# Patient Record
Sex: Male | Born: 1955 | Race: Black or African American | Hispanic: No | Marital: Single | Smoking: Never smoker
Health system: Southern US, Community
[De-identification: ages and names within clinical notes are randomized; demographics above are authoritative.]

## PROBLEM LIST (undated history)

## (undated) HISTORY — PX: OTHER SURGICAL HISTORY: SHX169

---

## 2006-08-28 ENCOUNTER — Emergency Department (HOSPITAL_COMMUNITY): Admission: EM | Admit: 2006-08-28 | Discharge: 2006-08-28 | Payer: Self-pay | Admitting: Emergency Medicine

## 2006-08-30 ENCOUNTER — Emergency Department (HOSPITAL_COMMUNITY): Admission: EM | Admit: 2006-08-30 | Discharge: 2006-08-30 | Payer: Self-pay | Admitting: Emergency Medicine

## 2006-09-01 ENCOUNTER — Emergency Department (HOSPITAL_COMMUNITY): Admission: EM | Admit: 2006-09-01 | Discharge: 2006-09-01 | Payer: Self-pay | Admitting: Emergency Medicine

## 2006-12-05 ENCOUNTER — Emergency Department (HOSPITAL_COMMUNITY): Admission: EM | Admit: 2006-12-05 | Discharge: 2006-12-06 | Payer: Self-pay | Admitting: Emergency Medicine

## 2008-10-07 ENCOUNTER — Encounter: Admission: RE | Admit: 2008-10-07 | Discharge: 2008-10-07 | Payer: Self-pay | Admitting: Gastroenterology

## 2009-08-18 ENCOUNTER — Emergency Department (HOSPITAL_COMMUNITY): Admission: EM | Admit: 2009-08-18 | Discharge: 2009-08-18 | Payer: Self-pay | Admitting: Emergency Medicine

## 2010-01-12 IMAGING — CR DG SMALL BOWEL
4 series · 4 of 4 positions shown · non-contrast
Comparison: None

CLINICAL DATA: Anemia.

SMALL BOWEL SERIES
TECHNIQUE: Following ingestion of a mixture of thin barium and
Entero Vu, serial small bowel images were obtained including spot
views of the terminal ileum.
Fluoroscopy time:  0.5 minutes.

[view not recorded (1 of 4)]
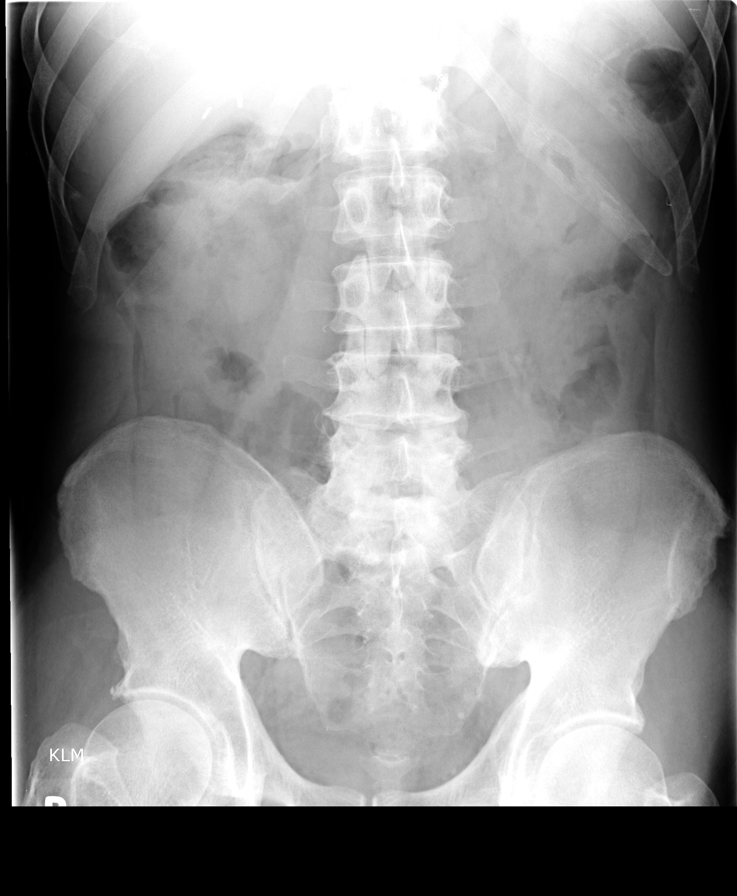

[view not recorded (2 of 4)]
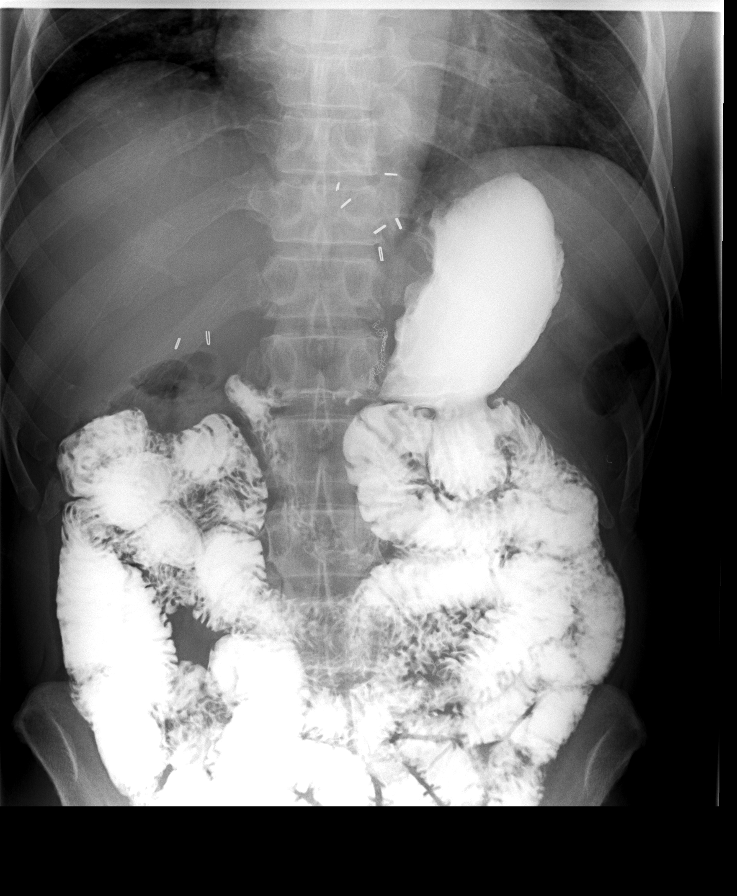

[view not recorded (3 of 4)]
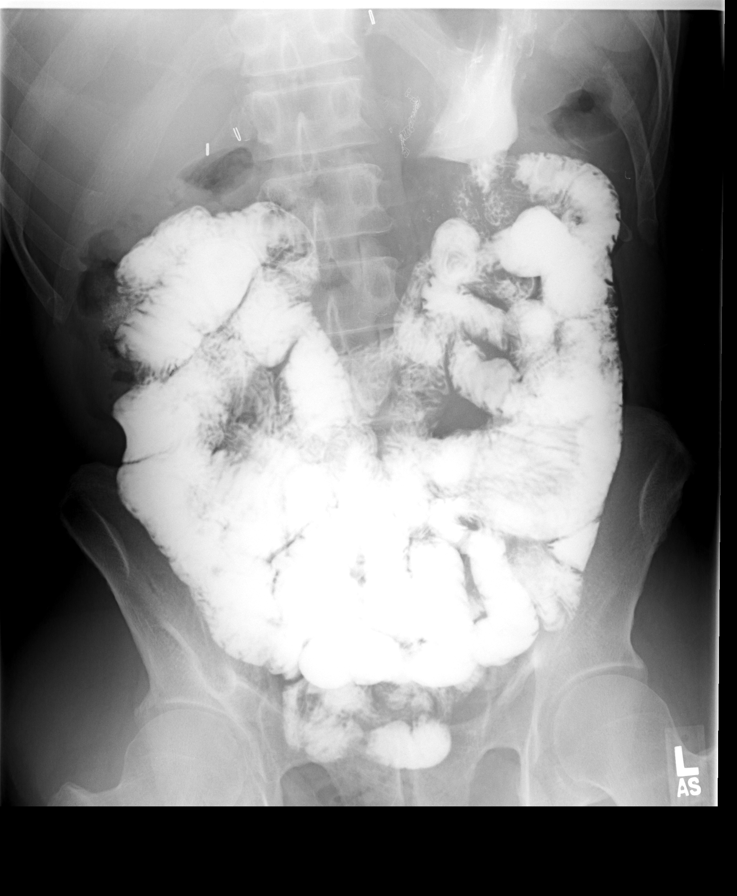

[view not recorded (4 of 4)]
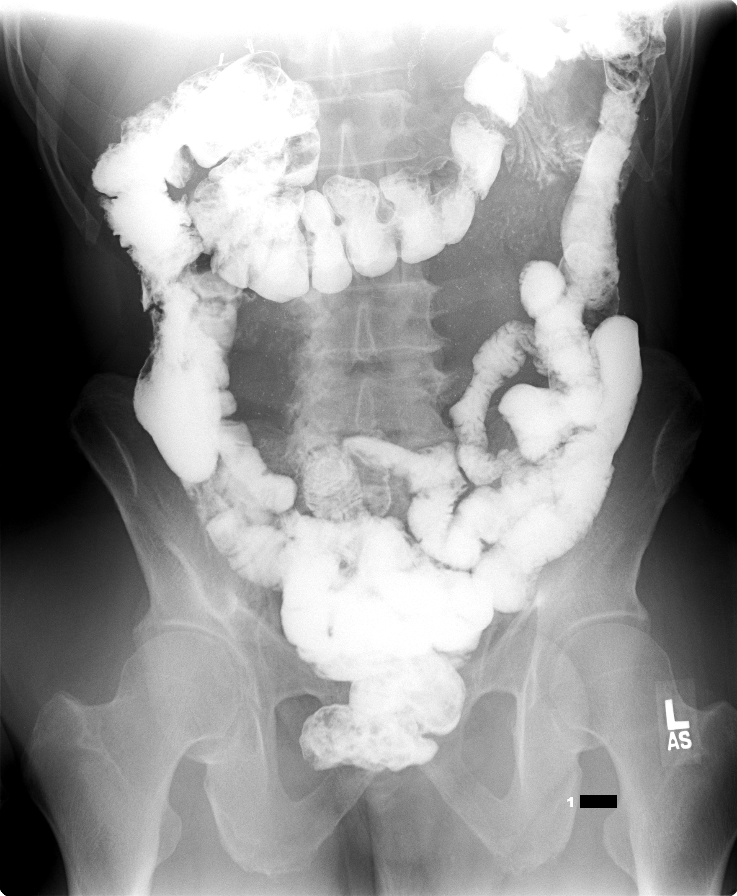

[4 of 4 positions shown; findings below may reference images not displayed]

FINDINGS: Scout view of the abdomen shows a normal bowel gas
pattern.  Postoperative changes are seen in the upper abdomen.
Mild degenerative changes are seen in the right hip and lumbosacral
spine.

Opacification of the small bowel shows a small bowel transit time
of 1 hour and 30 minutes.  Small bowel fold pattern is within
normal limits.  Terminal ileum is unremarkable.  No evidence of
stricture or obstruction.
IMPRESSION: No acute findings.

REF:G3 DICTATED: 10/07/2008 [DATE]

## 2011-04-29 ENCOUNTER — Inpatient Hospital Stay (INDEPENDENT_AMBULATORY_CARE_PROVIDER_SITE_OTHER)
Admission: RE | Admit: 2011-04-29 | Discharge: 2011-04-29 | Disposition: A | Discharge status: Home / Self Care | Payer: BC Managed Care – PPO | Source: Ambulatory Visit | Attending: Emergency Medicine | Admitting: Emergency Medicine

## 2011-04-29 DIAGNOSIS — R066 Hiccough: Secondary | ICD-10-CM

## 2011-12-07 ENCOUNTER — Emergency Department (HOSPITAL_COMMUNITY)
Admission: EM | Admit: 2011-12-07 | Discharge: 2011-12-08 | Disposition: A | Discharge status: Home / Self Care | Payer: BC Managed Care – PPO | Attending: Emergency Medicine | Admitting: Emergency Medicine

## 2011-12-07 ENCOUNTER — Encounter (HOSPITAL_COMMUNITY): Payer: Self-pay | Admitting: Emergency Medicine

## 2011-12-07 DIAGNOSIS — J069 Acute upper respiratory infection, unspecified: Secondary | ICD-10-CM | POA: Insufficient documentation

## 2011-12-07 DIAGNOSIS — J329 Chronic sinusitis, unspecified: Secondary | ICD-10-CM | POA: Insufficient documentation

## 2011-12-07 NOTE — ED Notes (Signed)
PT. REPORTS NASAL CONGESTION , OCCASIONAL PRODUCTIVE COUGH AND SNEEZING FOR SEVERAL DAYS UNRELIEVED BY OTC MEDICATIONS.

## 2011-12-08 ENCOUNTER — Encounter (HOSPITAL_COMMUNITY): Payer: Self-pay

## 2011-12-08 ENCOUNTER — Emergency Department (HOSPITAL_COMMUNITY): Payer: BC Managed Care – PPO

## 2011-12-08 MED ORDER — ALBUTEROL SULFATE (5 MG/ML) 0.5% IN NEBU
5.0000 mg | INHALATION_SOLUTION | Freq: Once | RESPIRATORY_TRACT | Status: AC
  Administered 2011-12-08: 5 mg via RESPIRATORY_TRACT
  Filled 2011-12-08: qty 0.5

## 2011-12-08 MED ORDER — IBUPROFEN 800 MG PO TABS: 800.0000 mg | ORAL_TABLET | Freq: Three times a day (TID) | ORAL | Status: AC

## 2011-12-08 MED ORDER — ACETAMINOPHEN-CODEINE 120-12 MG/5ML PO SUSP: 5.0000 mL | Freq: Four times a day (QID) | ORAL | Status: AC | PRN

## 2011-12-08 MED ORDER — ALBUTEROL SULFATE HFA 108 (90 BASE) MCG/ACT IN AERS
2.0000 | INHALATION_SPRAY | RESPIRATORY_TRACT | Status: DC | PRN
  Administered 2011-12-08: 2 via RESPIRATORY_TRACT
  Filled 2011-12-08: qty 6.7

## 2011-12-08 MED ORDER — OXYMETAZOLINE HCL 0.05 % NA SOLN
1.0000 | Freq: Once | NASAL | Status: AC
  Administered 2011-12-08: 1 via NASAL
  Filled 2011-12-08: qty 15

## 2011-12-08 MED ORDER — AZITHROMYCIN 250 MG PO TABS: ORAL_TABLET | ORAL | Status: AC

## 2011-12-08 NOTE — ED Provider Notes (Signed)
History     CSN: [id number]  Arrival date & time 12/07/11  2229   First MD Initiated Contact with Patient 12/08/11 0122      Chief Complaint  Patient presents with  . Nasal Congestion    (Consider location/radiation/quality/duration/timing/severity/associated sxs/prior treatment) HPI History provided by the patient. Cough cold and congestion for the last week now. He does have some productive cough and is now keeping him awake at night. He denies any recorded fevers. No chills. No nausea vomiting or diarrhea. No rashes. No known sick contacts. Moderate in severity. He denies difficulty breathing or wheezing and does not have a smoking history. No chest pains. No hemoptysis. He is taking over-the-counter medications with minimal relief. No known aggravating or alleviating factors.   History reviewed. No pertinent past medical history.  Past Surgical History  Procedure Date  . Ulcers 1977-78    Family History  Problem Relation Age of Onset  . Diabetes Sister     History  Substance Use Topics  . Smoking status: Never Smoker   . Smokeless tobacco: Not on file  . Alcohol Use: 1.8 oz/week    3 Cans of beer per week      Review of Systems  Constitutional: Negative for fever and chills.  HENT: Positive for congestion and sneezing. Negative for neck pain and neck stiffness.   Eyes: Negative for pain.  Respiratory: Positive for cough.   Cardiovascular: Negative for chest pain.  Gastrointestinal: Negative for nausea, vomiting, abdominal pain, diarrhea and blood in stool.  Genitourinary: Negative for dysuria.  Musculoskeletal: Negative for back pain.  Skin: Negative for rash.  Neurological: Negative for headaches.  All other systems reviewed and are negative.    Allergies  Review of patient's allergies indicates no known allergies.  Home Medications   Current Outpatient Rx  Name Route Sig Dispense Refill  . ALKA-SELTZER PLUS COLD PO Oral Take 2 tablets by mouth  every 4 (four) hours as needed. For cold symptoms    . IBUPROFEN 200 MG PO TABS Oral Take 200 mg by mouth every 6 (six) hours as needed. For pain      BP 148/91  Pulse 79  Temp(Src) 97.8 F (36.6 C) (Oral)  Resp 16  SpO2 97%  Physical Exam  Constitutional: He is oriented to person, place, and time. He appears well-developed and well-nourished.  HENT:  Head: Normocephalic and atraumatic.       Oral pharynx is clear. Mild nasal congestion. Mild tenderness over maxillary sinuses  Eyes: Conjunctivae and EOM are normal. Pupils are equal, round, and reactive to light.  Neck: Trachea normal. Neck supple. No thyromegaly present.  Cardiovascular: Normal rate, regular rhythm, S1 normal, S2 normal and normal pulses.     No systolic murmur is present   No diastolic murmur is present  Pulses:      Radial pulses are 2+ on the right side, and 2+ on the left side.  Pulmonary/Chest: He has no rhonchi.       Mildly decreased breath sounds with intermittent expiratory wheezes. Otherwise good air movement with no respiratory distress.  Abdominal: Soft. Normal appearance and bowel sounds are normal. There is no tenderness. There is no CVA tenderness and negative Murphy's sign.  Musculoskeletal:       BLE:s Calves nontender, no cords or erythema, negative Homans sign  Neurological: He is alert and oriented to person, place, and time. He has normal strength. No cranial nerve deficit or sensory deficit. GCS eye subscore is 4.  GCS verbal subscore is 5. GCS motor subscore is 6.  Skin: Skin is warm and dry. No rash noted. He is not diaphoretic.  Psychiatric: His speech is normal.       Cooperative and appropriate    ED Course  Procedures (including critical care time)  Patient given albuterol treatment which improved his symptoms and resolved his intermittent wheezes  No results found for this or any previous visit. Dg Chest 2 View  12/08/2011  *RADIOLOGY REPORT*  Clinical Data: Nasal congestion.   CHEST - 2 VIEW  Comparison: None.  Findings: The lungs are well-aerated and clear.  There is no evidence of focal opacification, pleural effusion or pneumothorax.  The heart is normal in size; the mediastinal contour is within normal limits.  No acute osseous abnormalities are seen.  Scattered clips are noted about the upper abdomen.  A bowel suture line is also noted at the upper abdomen.  IMPRESSION: No acute cardiopulmonary process seen.  Original Report Authenticated By: Tonia Ghent, M.D.    Chest x-ray obtained and reviewed as above.  MDM   Diagnosis sinusitis with URI. Albuterol inhaler provided with prescription as needed. Prescription for Tylenol with codeine provided for cough as needed. Vital signs within normal limits.  Pulse ox 97% room air is adequate  Stable for discharge home and outpatient followup as needed        Sunnie Nielsen, MD 12/08/11 [phone]

## 2011-12-08 NOTE — Discharge Instructions (Signed)
     Rest and take medications as prescribed. Be sure to drink plenty of fluids. Followup in the clinic as needed and return here for any worsening condition  Cool Mist Vaporizers Vaporizers may help relieve the symptoms of a cough and cold. By adding water to the air, mucus may become thinner and less sticky. This makes it easier to breathe and cough up secretions. Vaporizers have not been proven to show they help with colds. You should not use a vaporizer if you are allergic to mold. Cool mist vaporizers do not cause serious burns like hot mist vaporizers ("steamers"). HOME CARE INSTRUCTIONS  Follow the package instructions for your vaporizer.   Use a vaporizer that holds a large volume of water (1 to 2 gallons [5.7 to 7.5 liters]).   Do not use anything other than distilled water in the vaporizer.   Do not run the vaporizer all of the time. This can cause mold or bacteria to grow in the vaporizer.   Clean the vaporizer after each time you use it.   Clean and dry the vaporizer well before you store it.   Stop using a vaporizer if you develop worsening respiratory symptoms.  Document Released: 05/06/2004 Document Revised: 07/29/2011 Document Reviewed: 04/03/2009 [facility] Patient Information 2012 [place], [place].

## 2013-12-17 ENCOUNTER — Other Ambulatory Visit: Payer: Self-pay | Admitting: Family Medicine

## 2013-12-17 ENCOUNTER — Ambulatory Visit
Admission: RE | Admit: 2013-12-17 | Discharge: 2013-12-17 | Disposition: A | Discharge status: Home / Self Care | Payer: No Typology Code available for payment source | Source: Ambulatory Visit | Attending: Family Medicine | Admitting: Family Medicine

## 2013-12-17 DIAGNOSIS — M25551 Pain in right hip: Secondary | ICD-10-CM

## 2015-03-24 IMAGING — CR DG HIP COMPLETE 2+V*R*
2 series · 2 of 2 positions shown · non-contrast
Comparison: None.

CLINICAL DATA: Pain

EXAM:
RIGHT HIP - COMPLETE 2+ VIEW

[t hip ap right]
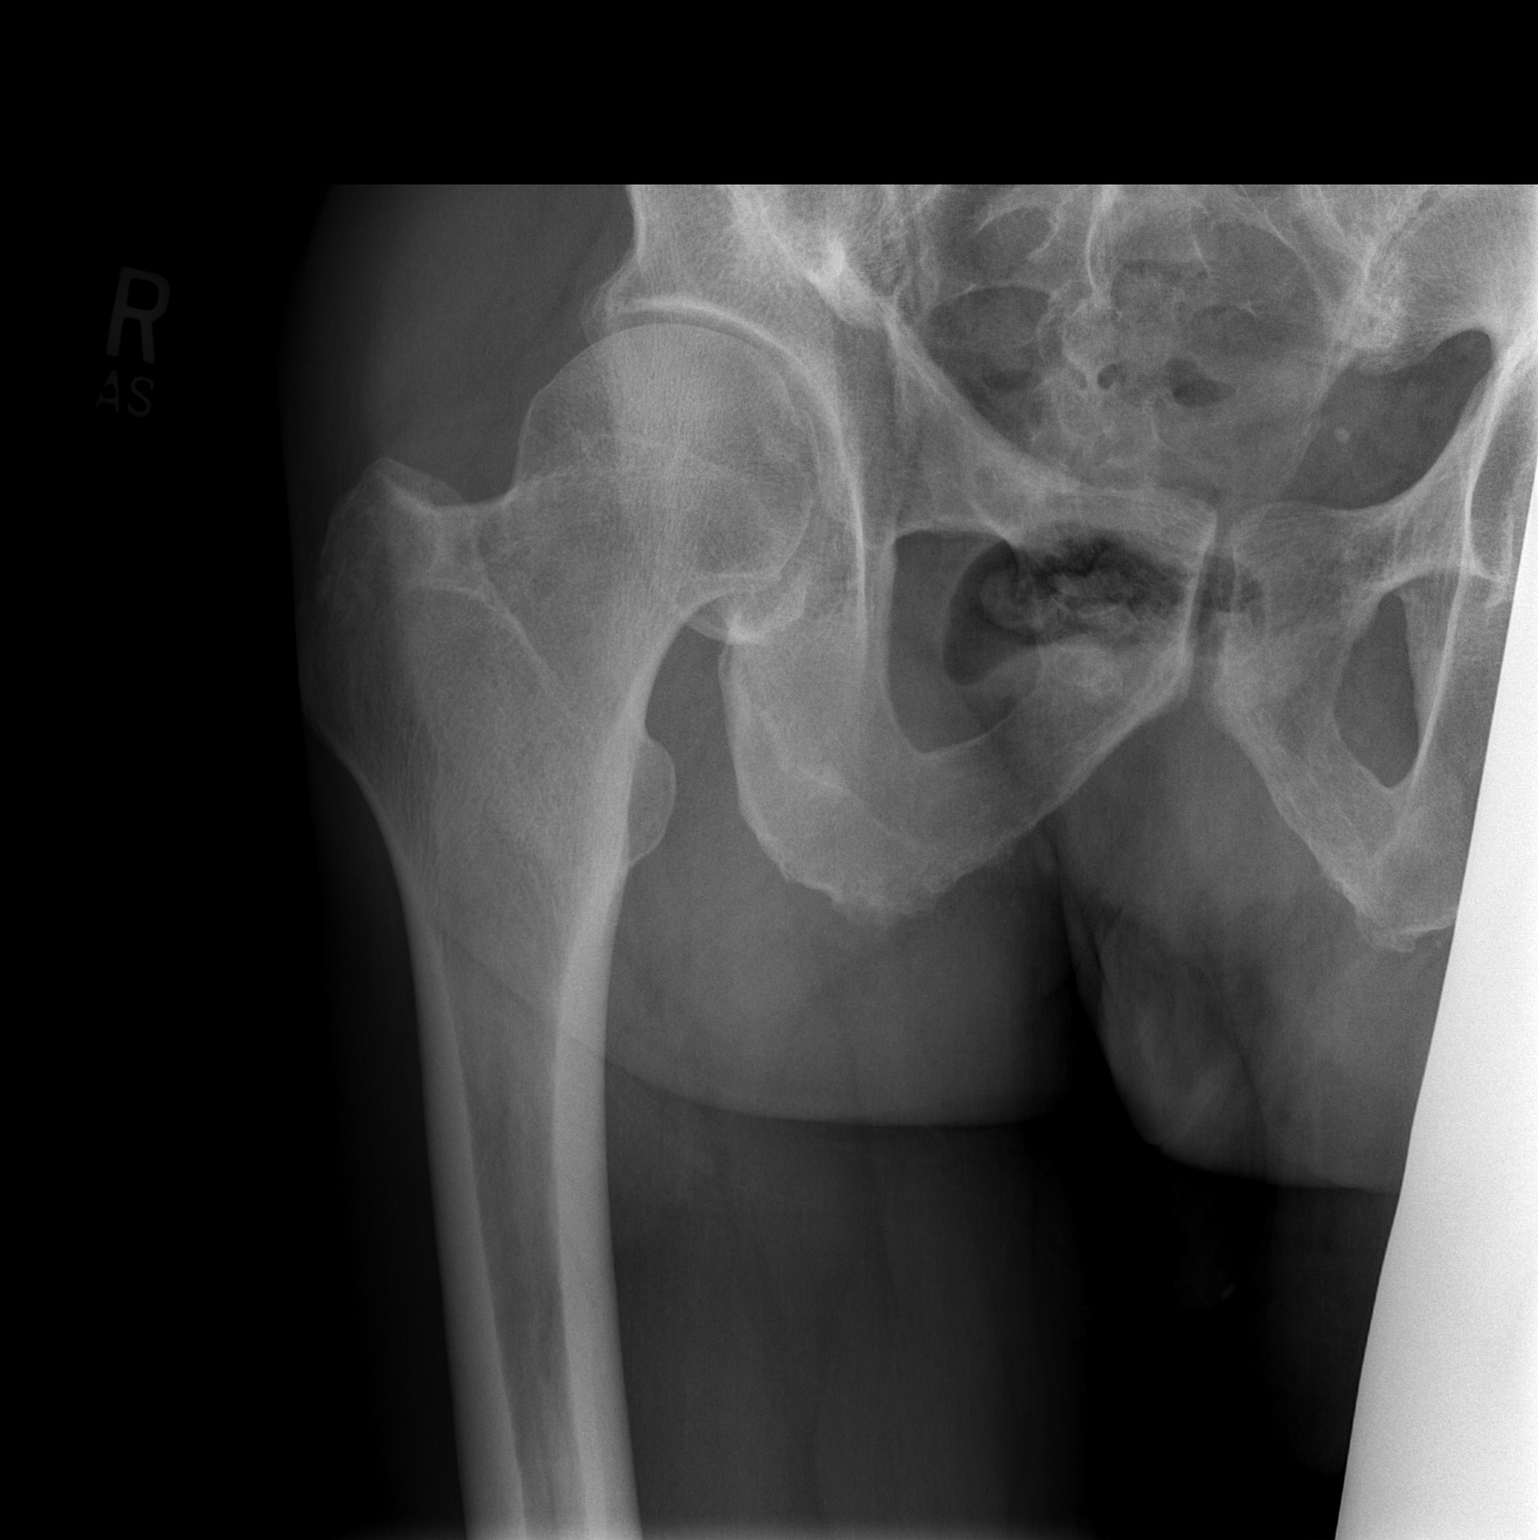

[t hip frog leg right]
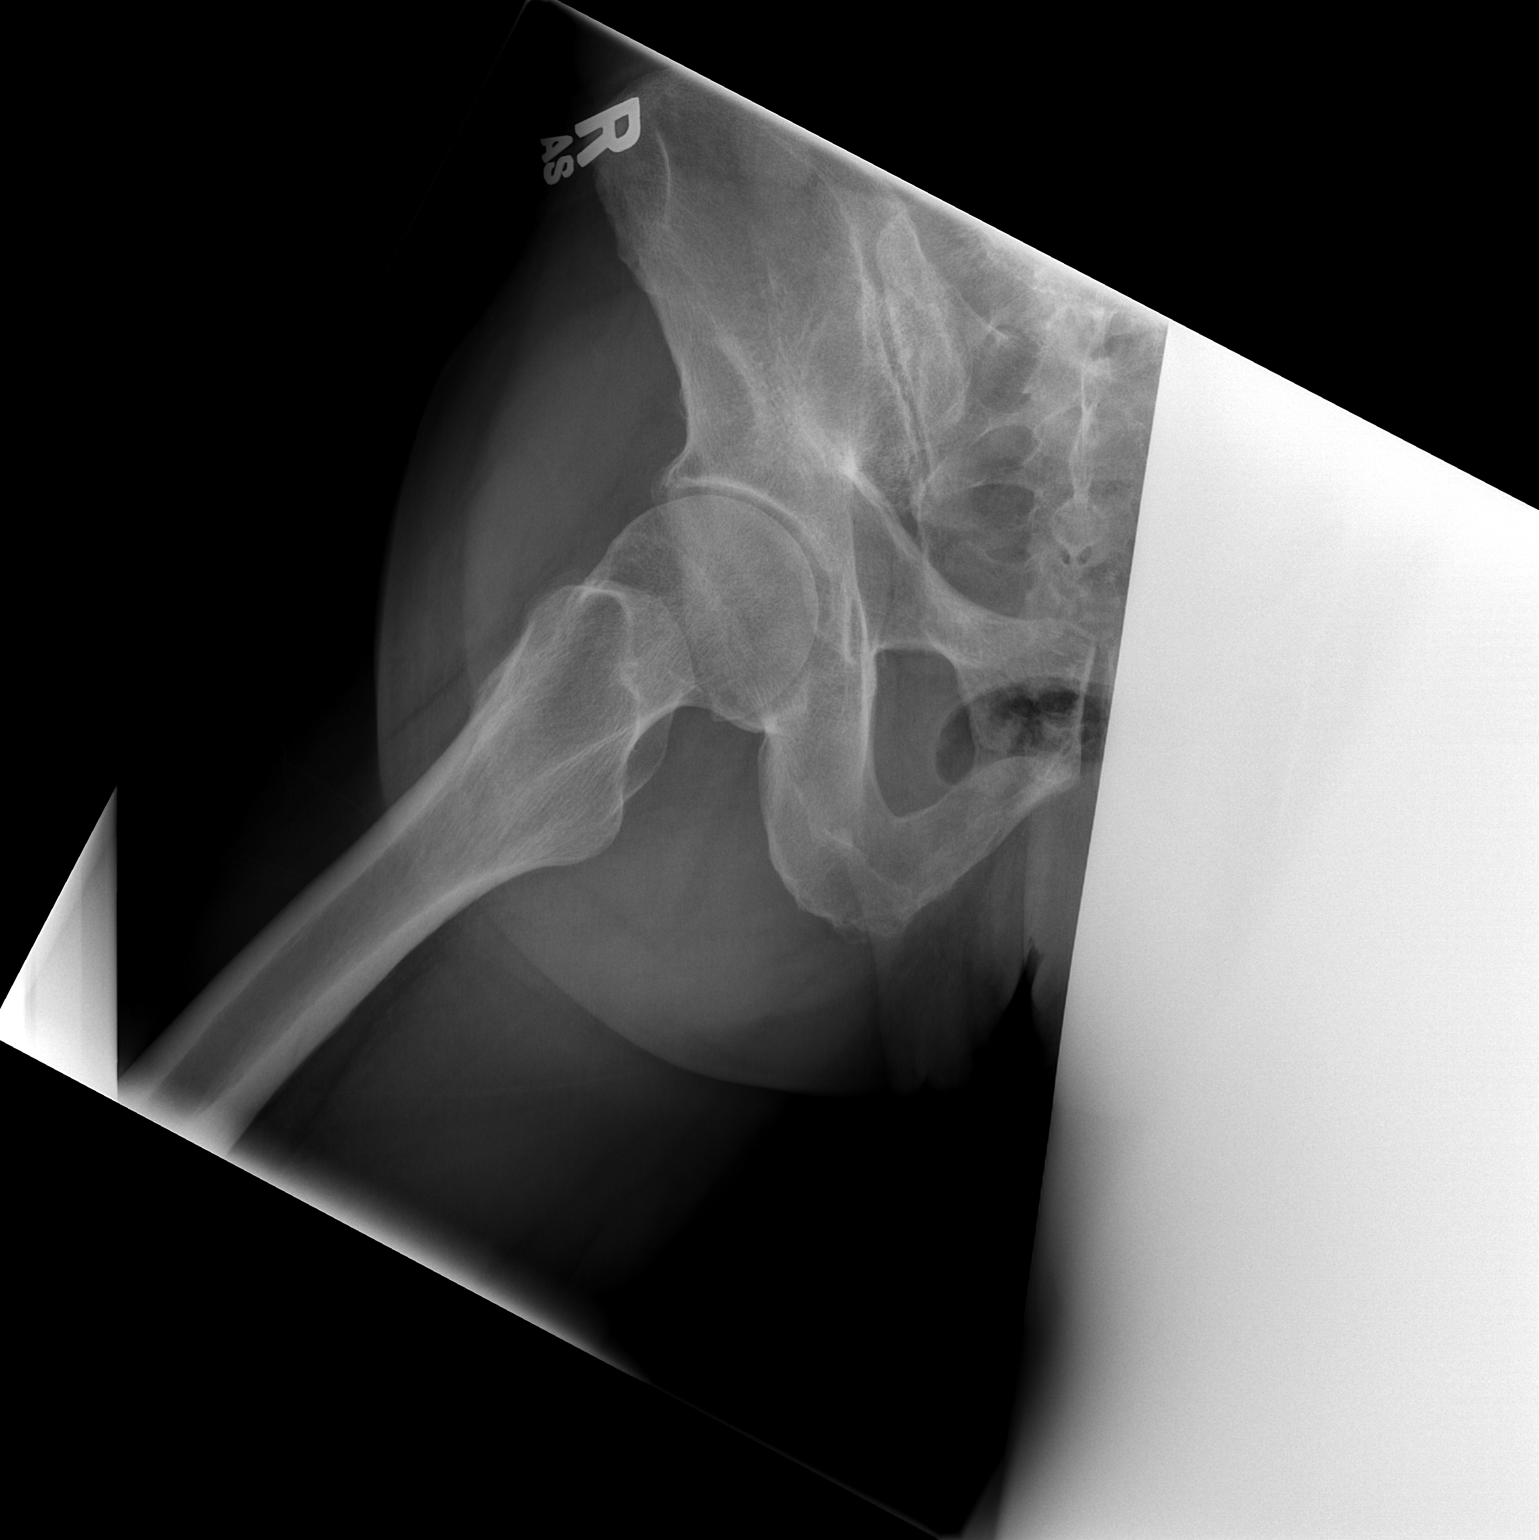

[2 of 2 positions shown; findings below may reference images not displayed]

FINDINGS: Frontal and lateral views were obtained. There is slight narrowing
of the right hip joint. No erosive change. No fracture or
dislocation.
IMPRESSION: Mild narrowing right hip joint.  No fracture or dislocation.

## 2015-08-15 ENCOUNTER — Ambulatory Visit (INDEPENDENT_AMBULATORY_CARE_PROVIDER_SITE_OTHER): Payer: BC Managed Care – PPO | Admitting: Family Medicine

## 2015-08-15 VITALS — BP 132/78 | HR 83 | Temp 97.8°F | Resp 20 | Ht 66.0 in | Wt 163.6 lb

## 2015-08-15 DIAGNOSIS — J0100 Acute maxillary sinusitis, unspecified: Secondary | ICD-10-CM

## 2015-08-15 DIAGNOSIS — H43392 Other vitreous opacities, left eye: Secondary | ICD-10-CM

## 2015-08-15 DIAGNOSIS — Z7189 Other specified counseling: Secondary | ICD-10-CM

## 2015-08-15 DIAGNOSIS — Z23 Encounter for immunization: Secondary | ICD-10-CM

## 2015-08-15 DIAGNOSIS — Z7185 Encounter for immunization safety counseling: Secondary | ICD-10-CM

## 2015-08-15 MED ORDER — AMOXICILLIN-POT CLAVULANATE 875-125 MG PO TABS: 1.0000 | ORAL_TABLET | Freq: Two times a day (BID) | ORAL | Status: DC

## 2015-08-15 NOTE — Progress Notes (Signed)
By signing my name below, I, Stann Ore, attest that this documentation has been prepared under the direction and in the presence of Elvina Sidle, MD. Electronically Signed: Stann Ore, Scribe. 08/15/2015 , 6:10 PM .  Patient was seen in room 8 .   Patient ID: Riley Spencer MRN: [id number], DOB: March 11, 1956, 59 y.o. Date of Encounter: 08/15/2015  Primary Physician: Default, Provider, MD  Chief Complaint:  Chief Complaint  Patient presents with   Sinus Problem    x 2 week    Flu Vaccine    HPI:  Riley Spencer is a 59 y.o. male who presents to [facility] complaining of sinus problem for 2 weeks with nasal congestion, rhinorrhea and cough. He mentions having blood sometimes when he blows out his nose. He had a cold previously.   He also noticed floaters in his left eye.   He also requests having a flu shot.   He works for [employer] and [employer].   History reviewed. No pertinent past medical history.   Home Meds: Prior to Admission medications   Medication Sig Start Date End Date Taking? Authorizing Provider  Chlorphen-Phenyleph-ASA (ALKA-SELTZER PLUS COLD PO) Take 2 tablets by mouth every 4 (four) hours as needed. Reported on 08/15/2015    Historical Provider, MD  ibuprofen (ADVIL,MOTRIN) 200 MG tablet Take 200 mg by mouth every 6 (six) hours as needed. Reported on 08/15/2015    Historical Provider, MD    Allergies: No Known Allergies  Social History   Social History   Marital Status: Single    Spouse Name: N/A   Number of Children: N/A   Years of Education: N/A   Occupational History   Not on file.   Social History Main Topics   Smoking status: Never Smoker    Smokeless tobacco: Not on file   Alcohol Use: 1.8 oz/week    3 Cans of beer per week   Drug Use: No   Sexual Activity: Not on file   Other Topics Concern   Not on file   Social History Narrative     Review of Systems: Constitutional: negative for  fever, chills, night sweats, weight changes, or fatigue  HEENT: negative for vision changes, hearing loss, ST, epistaxis; positive for sinus pressure, congestion, rhinorrhea Cardiovascular: negative for chest pain or palpitations Respiratory: negative for hemoptysis, wheezing, shortness of breath; positive for cough Abdominal: negative for abdominal pain, nausea, vomiting, diarrhea, or constipation Dermatological: negative for rash Neurologic: negative for headache, dizziness, or syncope All other systems reviewed and are otherwise negative with the exception to those above and in the HPI.  Physical Exam: Blood pressure 132/78, pulse 83, temperature 97.8 F (36.6 C), temperature source Oral, resp. rate 20, height  (1.676 m), weight 163 lb 9.6 oz (74.208 kg), SpO2 98 %., Body mass index is 26.42 kg/(m^2). General: Well developed, well nourished, in no acute distress. Head: Normocephalic, atraumatic, eyes without discharge, sclera non-icteric, nares are without discharge. Bilateral auditory canals clear, TM's are without perforation, pearly grey and translucent with reflective cone of light bilaterally. Oral cavity moist, posterior pharynx without exudate, erythema, peritonsillar abscess, or post nasal drip. Neck: Supple. No thyromegaly. Full ROM. No lymphadenopathy. proteinaceous debris in his left eye Lungs: Clear bilaterally to auscultation without wheezes, rales, or rhonchi. Breathing is unlabored. Heart: RRR with S1 S2. No murmurs, rubs, or gallops appreciated. Msk:  Strength and tone normal for age. Extremities/Skin: Warm and dry. No clubbing or cyanosis. No edema. No  rashes or suspicious lesions. Neuro: Alert and oriented X 3. Moves all extremities spontaneously. Gait is normal. CNII-XII grossly in tact. Psych:  Responds to questions appropriately with a normal affect.    ASSESSMENT AND PLAN:  59 y.o. year old male with several minor issues This chart was scribed in my presence and  reviewed by me personally.    ICD-9-CM ICD-10-CM   1. Acute maxillary sinusitis, recurrence not specified 461.0 J01.00 amoxicillin-clavulanate (AUGMENTIN) 875-125 MG tablet  2. Immunization counseling V65.49 Z71.89 Flu Vaccine QUAD 36+ mos IM  3. Floaters in visual field, left 379.24 [id number]      Signed, Elvina SidleKurt Lauenstein, MD 08/15/2015 6:10 PM

## 2015-08-15 NOTE — Patient Instructions (Signed)
Sinusitis, Adult Sinusitis is redness, soreness, and inflammation of the paranasal sinuses. Paranasal sinuses are air pockets within the bones of your face. They are located beneath your eyes, in the middle of your forehead, and above your eyes. In healthy paranasal sinuses, mucus is able to drain out, and air is able to circulate through them by way of your nose. However, when your paranasal sinuses are inflamed, mucus and air can become trapped. This can allow bacteria and other germs to grow and cause infection. Sinusitis can develop quickly and last only a short time (acute) or continue over a long period (chronic). Sinusitis that lasts for more than 12 weeks is considered chronic. CAUSES Causes of sinusitis include:  Allergies.  Structural abnormalities, such as displacement of the cartilage that separates your nostrils (deviated septum), which can decrease the air flow through your nose and sinuses and affect sinus drainage.  Functional abnormalities, such as when the small hairs (cilia) that line your sinuses and help remove mucus do not work properly or are not present. SIGNS AND SYMPTOMS Symptoms of acute and chronic sinusitis are the same. The primary symptoms are pain and pressure around the affected sinuses. Other symptoms include:  Upper toothache.  Earache.  Headache.  Bad breath.  Decreased sense of smell and taste.  A cough, which worsens when you are lying flat.  Fatigue.  Fever.  Thick drainage from your nose, which often is green and may contain pus (purulent).  Swelling and warmth over the affected sinuses. DIAGNOSIS Your health care provider will perform a physical exam. During your exam, your health care provider may perform any of the following to help determine if you have acute sinusitis or chronic sinusitis:  Look in your nose for signs of abnormal growths in your nostrils (nasal polyps).  Tap over the affected sinus to check for signs of  infection.  View the inside of your sinuses using an imaging device that has a light attached (endoscope). If your health care provider suspects that you have chronic sinusitis, one or more of the following tests may be recommended:  Allergy tests.  Nasal culture. A sample of mucus is taken from your nose, sent to a lab, and screened for bacteria.  Nasal cytology. A sample of mucus is taken from your nose and examined by your health care provider to determine if your sinusitis is related to an allergy. TREATMENT Most cases of acute sinusitis are related to a viral infection and will resolve on their own within 10 days. Sometimes, medicines are prescribed to help relieve symptoms of both acute and chronic sinusitis. These may include pain medicines, decongestants, nasal steroid sprays, or saline sprays. However, for sinusitis related to a bacterial infection, your health care provider will prescribe antibiotic medicines. These are medicines that will help kill the bacteria causing the infection. Rarely, sinusitis is caused by a fungal infection. In these cases, your health care provider will prescribe antifungal medicine. For some cases of chronic sinusitis, surgery is needed. Generally, these are cases in which sinusitis recurs more than 3 times per year, despite other treatments. HOME CARE INSTRUCTIONS  Drink plenty of water. Water helps thin the mucus so your sinuses can drain more easily.  Use a humidifier.  Inhale steam 3-4 times a day (for example, sit in the bathroom with the shower running).  Apply a warm, moist washcloth to your face 3-4 times a day, or as directed by your health care provider.  Use saline nasal sprays to help   moisten and clean your sinuses.  Take medicines only as directed by your health care provider.  If you were prescribed either an antibiotic or antifungal medicine, finish it all even if you start to feel better. SEEK IMMEDIATE MEDICAL CARE IF:  You have  increasing pain or severe headaches.  You have nausea, vomiting, or drowsiness.  You have swelling around your face.  You have vision problems.  You have a stiff neck.  You have difficulty breathing.   This information is not intended to replace advice given to you by your health care provider. Make sure you discuss any questions you have with your health care provider.   Document Released: 08/09/2005 Document Revised: 08/30/2014 Document Reviewed: 08/24/2011 Elsevier Interactive Patient Education 2016 [employer] Eye Floaters Eye floaters are specks of material that float around inside your eye. A jelly-like fluid (vitreous) fills the inside of your eye. The vitreous is normally clear. It allows light to pass through to tissues at the back of the eye (retina). The retina contains the nerves needed for vision.  Your vitreous can start to shrink and become stringy as you age. Strands of material may start to float around inside the eye. They come from clumps of cells, blood, or other materials. These objects cast shadows on the retina and show up as floaters. Floaters may be more obvious when you look up at the sky or at a bright, blank background. They do not go away completely. In time, however, they may settle below your line of sight. Floaters can be annoying. They do not usually cause vision problems. Sometimes floaters appear along with flashes. Flashes look like bright, quick streaks of light. They usually occur at the edge of your vision. Flashes result when your vitreous pulls on your retina. They also occur with age. However, they could be a warning sign of a detached retina. This is a serious condition that requires emergency treatment to prevent vision loss. CAUSES  For most people, eye floaters develop when the vitreous begins to shrink as a normal part of aging. More serious causes of floaters include:  A torn retina.  Injury.   Bleeding inside the eye. Diabetes and other  conditions can cause broken retinal blood vessels.  A blood clot in the major vein of the retina or its branches (retinal vein occlusion).  Retinal detachment.   Vitreous detachment.   Inflammation inside the eye (uveitis).   Infection inside the eye. RISK FACTORS You may have a higher risk for floaters if:   You are older.  You are nearsighted.  You have diabetes.  You have had cataracts removed. SIGNS AND SYMPTOMS  Symptoms of floaters include seeing small, shadowy shapes move across your vision. They move as your eyes move. They drift out of your vision when you keep your eyes still. These shapes may look like:  Specks.  Dots.  Circles.  Squiggly lines.  Thread. Symptoms of flashes include seeing:  Bursts of light.  Flashing lights.  Lightning streaks.  What is commonly referred to as "stars." DIAGNOSIS  Your health care provider may diagnose floaters and flashes based on your symptoms. You may need to see an eye care specialist (optometrist or ophthalmologist). The specialist will do an exam to determine whether your floaters are a normal part of aging or a warning sign of a more serious eye problem. The specialist may put drops in your eyes to open your pupils wide (dilate) and then use a special scope (slit lamp)  to look inside your eye.  TREATMENT  No treatment is needed for floaters that occur normally with age. Sometimes floaters become severe enough to affect your vision. In rare cases, surgery to remove the vitreous and replace it with a saltwater solution (vitrectomy) may be considered. HOME CARE INSTRUCTIONS Keep all follow-up visits as directed by your health care provider. This is important.  SEEK MEDICAL CARE IF:   You have a sudden increase in floaters.  You have floaters along with flashes.  You have floaters along with any new eye symptoms. SEEK IMMEDIATE MEDICAL CARE IF:   You have a sudden increase in floaters or flashes that interferes  with your vision.  Your vision suddenly changes.   This information is not intended to replace advice given to you by your health care provider. Make sure you discuss any questions you have with your health care provider.   Document Released: 08/12/2003 Document Revised: 08/30/2014 Document Reviewed: 04/03/2014 Elsevier Interactive Patient Education [employer]2016 [employer]

## 2016-03-31 ENCOUNTER — Encounter: Payer: Self-pay | Admitting: Physician Assistant

## 2016-03-31 ENCOUNTER — Ambulatory Visit (INDEPENDENT_AMBULATORY_CARE_PROVIDER_SITE_OTHER): Payer: BC Managed Care – PPO | Admitting: Physician Assistant

## 2016-03-31 VITALS — BP 156/100 | HR 87 | Temp 98.4°F | Resp 17 | Ht 66.0 in | Wt 152.0 lb

## 2016-03-31 DIAGNOSIS — R51 Headache: Secondary | ICD-10-CM

## 2016-03-31 DIAGNOSIS — I10 Essential (primary) hypertension: Secondary | ICD-10-CM | POA: Diagnosis not present

## 2016-03-31 DIAGNOSIS — R519 Headache, unspecified: Secondary | ICD-10-CM

## 2016-03-31 LAB — POCT CBC
GRANULOCYTE PERCENT: 77.3 % (ref 37–80)
HCT, POC: 39.6 % — AB (ref 43.5–53.7)
Hemoglobin: 13.5 g/dL — AB (ref 14.1–18.1)
Lymph, poc: 1 (ref 0.6–3.4)
MCH, POC: 30.6 pg (ref 27–31.2)
MCHC: 34.2 g/dL (ref 31.8–35.4)
MCV: 89.4 fL (ref 80–97)
MID (cbc): 0.5 (ref 0–0.9)
MPV: 6.3 fL (ref 0–99.8)
PLATELET COUNT, POC: 287 10*3/uL (ref 142–424)
POC Granulocyte: 5 (ref 2–6.9)
POC LYMPH %: 15.5 % (ref 10–50)
POC MID %: 7.2 %M (ref 0–12)
RBC: 4.42 M/uL — AB (ref 4.69–6.13)
RDW, POC: 15.7 %
WBC: 6.5 10*3/uL (ref 4.6–10.2)

## 2016-03-31 LAB — POCT SEDIMENTATION RATE: POCT SED RATE: 20 mm/h (ref 0–22)

## 2016-03-31 LAB — GLUCOSE, POCT (MANUAL RESULT ENTRY): POC GLUCOSE: 104 mg/dL — AB (ref 70–99)

## 2016-03-31 LAB — TSH: TSH: 1.35 mIU/L (ref 0.40–4.50)

## 2016-03-31 MED ORDER — BUTALBITAL-APAP-CAFFEINE 50-325-40 MG PO TABS: 1.0000 | ORAL_TABLET | Freq: Four times a day (QID) | ORAL | 0 refills | Status: DC | PRN

## 2016-03-31 MED ORDER — AMLODIPINE BESYLATE 5 MG PO TABS
5.0000 mg | ORAL_TABLET | Freq: Every day | ORAL | 1 refills | Status: DC
Start: 2016-03-31 — End: 2016-06-01

## 2016-03-31 NOTE — Patient Instructions (Addendum)
Please make sure you are hydrating well with 64 oz or more of water.  I would like you to make sure that you are also getting vegetables in daily. Please take the amlodipine as prescribed.  Check your blood pressure twice per week.  Record the numbers.  We need to make sure it is under 140/90.  You can do this at the pharmacy.  Ask today. Please take tylenol for head pain if needed.  I would also like you to get your eyes rechecked by an optometrist.    General Headache Without Cause A headache is pain or discomfort felt around the head or neck area. There are many causes and types of headaches. In some cases, the cause may not be found.  HOME CARE  Managing Pain  Take over-the-counter and prescription medicines only as told by your doctor.  Lie down in a dark, quiet room when you have a headache.  If directed, apply ice to the head and neck area:  Put ice in a plastic bag.  Place a towel between your skin and the bag.  Leave the ice on for 20 minutes, 2-3 times per day.  Use a heating pad or hot shower to apply heat to the head and neck area as told by your doctor.  Keep lights dim if bright lights bother you or make your headaches worse. Eating and Drinking  Eat meals on a regular schedule.  Lessen how much alcohol you drink.  Lessen how much caffeine you drink, or stop drinking caffeine. General Instructions  Keep all follow-up visits as told by your doctor. This is important.  Keep a journal to find out if certain things bring on headaches. For example, write down:  What you eat and drink.  How much sleep you get.  Any change to your diet or medicines.  Relax by getting a massage or doing other relaxing activities.  Lessen stress.  Sit up straight. Do not tighten (tense) your muscles.  Do not use tobacco products. This includes cigarettes, chewing tobacco, or e-cigarettes. If you need help quitting, ask your doctor.  Exercise regularly as told by your  doctor.  Get enough sleep. This often means 7-9 hours of sleep. GET HELP IF:  Your symptoms are not helped by medicine.  You have a headache that feels different than the other headaches.  You feel sick to your stomach (nauseous) or you throw up (vomit).  You have a fever. GET HELP RIGHT AWAY IF:   Your headache becomes really bad.  You keep throwing up.  You have a stiff neck.  You have trouble seeing.  You have trouble speaking.  You have pain in the eye or ear.  Your muscles are weak or you lose muscle control.  You lose your balance or have trouble walking.  You feel like you will pass out (faint) or you pass out.  You have confusion.   This information is not intended to replace advice given to you by your health care provider. Make sure you discuss any questions you have with your health care provider.   Document Released: 05/18/2008 Document Revised: 04/30/2015 Document Reviewed: 12/02/2014 Elsevier Interactive Patient Education 2016 Elsevier Inc.  DASH Eating Plan DASH stands for "Dietary Approaches to Stop Hypertension." The DASH eating plan is a healthy eating plan that has been shown to reduce high blood pressure (hypertension). Additional health benefits may include reducing the risk of type 2 diabetes mellitus, heart disease, and stroke. The DASH eating plan  may also help with weight loss. WHAT DO I NEED TO KNOW ABOUT THE DASH EATING PLAN? For the DASH eating plan, you will follow these general guidelines:  Choose foods with a percent daily value for sodium of less than 5% (as listed on the food label).  Use salt-free seasonings or herbs instead of table salt or sea salt.  Check with your health care provider or pharmacist before using salt substitutes.  Eat lower-sodium products, often labeled as "lower sodium" or "no salt added."  Eat fresh foods.  Eat more vegetables, fruits, and low-fat dairy products.  Choose whole grains. Look for the word  "whole" as the first word in the ingredient list.  Choose fish and skinless chicken or [place] more often than red meat. Limit fish, poultry, and meat to 6 oz (170 g) each day.  Limit sweets, desserts, sugars, and sugary drinks.  Choose heart-healthy fats.  Limit cheese to 1 oz (28 g) per day.  Eat more home-cooked food and less restaurant, buffet, and fast food.  Limit fried foods.  Cook foods using methods other than frying.  Limit canned vegetables. If you do use them, rinse them well to decrease the sodium.  When eating at a restaurant, ask that your food be prepared with less salt, or no salt if possible. WHAT FOODS CAN I EAT? Seek help from a dietitian for individual calorie needs. Grains Whole grain or whole wheat bread. Brown rice. Whole grain or whole wheat pasta. Quinoa, bulgur, and whole grain cereals. Low-sodium cereals. Corn or whole wheat flour tortillas. Whole grain cornbread. Whole grain crackers. Low-sodium crackers. Vegetables Fresh or frozen vegetables (raw, steamed, roasted, or grilled). Low-sodium or reduced-sodium tomato and vegetable juices. Low-sodium or reduced-sodium tomato sauce and paste. Low-sodium or reduced-sodium canned vegetables.  Fruits All fresh, canned (in natural juice), or frozen fruits. Meat and Other Protein Products Ground beef (85% or leaner), grass-fed beef, or beef trimmed of fat. Skinless chicken or [place]. Ground chicken or [place]. Pork trimmed of fat. All fish and seafood. Eggs. Dried beans, peas, or lentils. Unsalted nuts and seeds. Unsalted canned beans. Dairy Low-fat dairy products, such as skim or 1% milk, 2% or reduced-fat cheeses, low-fat ricotta or cottage cheese, or plain low-fat yogurt. Low-sodium or reduced-sodium cheeses. Fats and Oils Tub margarines without trans fats. Light or reduced-fat mayonnaise and salad dressings (reduced sodium). Avocado. Safflower, olive, or canola oils. Natural peanut or almond  butter. Other Unsalted popcorn and pretzels. The items listed above may not be a complete list of recommended foods or beverages. Contact your dietitian for more options. WHAT FOODS ARE NOT RECOMMENDED? Grains White bread. White pasta. White rice. Refined cornbread. Bagels and croissants. Crackers that contain trans fat. Vegetables Creamed or fried vegetables. Vegetables in a cheese sauce. Regular canned vegetables. Regular canned tomato sauce and paste. Regular tomato and vegetable juices. Fruits Dried fruits. Canned fruit in light or heavy syrup. Fruit juice. Meat and Other Protein Products Fatty cuts of meat. Ribs, chicken wings, bacon, sausage, bologna, salami, chitterlings, fatback, hot dogs, bratwurst, and packaged luncheon meats. Salted nuts and seeds. Canned beans with salt. Dairy Whole or 2% milk, cream, half-and-half, and cream cheese. Whole-fat or sweetened yogurt. Full-fat cheeses or blue cheese. Nondairy creamers and whipped toppings. Processed cheese, cheese spreads, or cheese curds. Condiments Onion and garlic salt, seasoned salt, table salt, and sea salt. Canned and packaged gravies. Worcestershire sauce. Tartar sauce. Barbecue sauce. Teriyaki sauce. Soy sauce, including reduced sodium. Steak sauce. Fish sauce. [employer]  sauce. Cocktail sauce. Horseradish. Ketchup and mustard. Meat flavorings and tenderizers. Bouillon cubes. Hot sauce. Tabasco sauce. Marinades. Taco seasonings. Relishes. Fats and Oils Butter, stick margarine, lard, shortening, ghee, and bacon fat. Coconut, palm kernel, or palm oils. Regular salad dressings. Other Pickles and olives. Salted popcorn and pretzels. The items listed above may not be a complete list of foods and beverages to avoid. Contact your dietitian for more information. WHERE CAN I FIND MORE INFORMATION? National Heart, Lung, and Blood Institute: [employer]   This information is not intended to replace  advice given to you by your health care provider. Make sure you discuss any questions you have with your health care provider.   Document Released: 07/29/2011 Document Revised: 08/30/2014 Document Reviewed: 06/13/2013 Elsevier Interactive Patient Education 2016 [employer].   IF you received an x-ray today, you will receive an invoice from [facility]. Please contact [facility] at [phone] with questions or concerns regarding your invoice.   IF you received labwork today, you will receive an invoice from [employer]. Please contact [facility] at [phone] with questions or concerns regarding your invoice.   Our billing staff will not be able to assist you with questions regarding bills from these companies.  You will be contacted with the lab results as soon as they are available. The fastest way to get your results is to activate your My Chart account. Instructions are located on the last page of this paperwork. If you have not heard from [place] regarding the results in 2 weeks, please contact this office.

## 2016-03-31 NOTE — Progress Notes (Addendum)
[facility] [address], [place] [address] [phone]  By signing my name below I, Raven Small, attest that this documentation has been prepared under the direction and in the presence of Trena PlattStephanie English PA. Electonically Signed. Raven Small, Scribe 03/31/2016 at 11:44 AM  Date:  03/31/2016   [name]   DOB:  07/23/1956   MRN:  [id number]  PCP:  Default, Provider, MD    History of Present Illness:  Riley Spencer is a [age] y.o. male patient who [facility] for a intermittent, frontal headache for the past 3 days. Pt suspects headache is due to sinuses reporting hx of seasonal allergies. He rates headache 5/10 and states headache has improved since onset. He has been taking 4 ibuprofen for the past couples days. He has been staying hydrating, drinking plenty of water and Gatorade. He denies nasal congestion, sore throat, cough, ear pain, dizziness, photophobia, nausea, sneezing, watery eyes.    BP today is 160/110. He denies hx of HTN, but reports family hx of HTN of his son. Pt is a social drinking on weekend. He is not a coffee drinker. Pt denies blood in stool.  No food restrictions with salt intake containing lots of salts, fastfood and cured meats.  There are no active problems to display for this patient.   No past medical history on file.  Past Surgical History:  Procedure Laterality Date   ulcers  1977-78    Social History  Substance Use Topics   Smoking status: Never Smoker   Smokeless tobacco: Not on file   Alcohol use 1.8 oz/week    3 Cans of beer per week    Family History  Problem Relation Age of Onset   Diabetes Sister     No Known Allergies  Medication list has been reviewed and updated.  Current Outpatient Prescriptions on File Prior to Visit  Medication Sig Dispense Refill   ibuprofen (ADVIL,MOTRIN) 200 MG tablet Take 200 mg by mouth every 6 (six) hours as needed. Reported on 08/15/2015     No current  facility-administered medications on file prior to visit.     Review of Systems  Constitutional: Negative for chills and fever.  HENT: Negative for congestion, ear pain and sore throat.   Eyes: Negative for photophobia and discharge.  Respiratory: Negative for cough.   Gastrointestinal: Negative for blood in stool and nausea.  Neurological: Positive for headaches. Negative for dizziness.  Endo/Heme/Allergies: Positive for environmental allergies.   ROS unremarkable unless otherwise specified.  Physical Examination: BP (!) 160/110    Pulse 87    Temp 98.4 F (36.9 C) (Oral)    Resp 17    Ht 5\' 6"  (1.676 m)    Wt 152 lb (68.9 kg)    SpO2 98%    BMI 24.53 kg/m  Ideal Body Weight: @FLOWAMB ([id number])@[phone])@  Physical Exam  Constitutional: He is oriented to person, place, and time. He appears well-developed and well-nourished. No distress.  HENT:  Head: Normocephalic and atraumatic.  Nose: Right sinus exhibits no maxillary sinus tenderness and no frontal sinus tenderness. Left sinus exhibits no maxillary sinus tenderness and no frontal sinus tenderness.  Mouth/Throat: Oropharynx is clear and moist.  Eyes: Conjunctivae and EOM are normal. Pupils are equal, round, and reactive to light.  Cardiovascular: Normal rate, regular rhythm, normal heart sounds and intact distal pulses.  Exam reveals no friction rub.   No murmur heard. Pulmonary/Chest: Effort normal and breath sounds normal. He  has no wheezes. He has no rales.  Neurological: He is alert and oriented to person, place, and time. He has normal strength. No cranial nerve deficit. He displays a negative Romberg sign.  Reflex Scores:      Bicep reflexes are 2+ on the right side and 2+ on the left side. Skin: Skin is warm and dry. He is not diaphoretic.  Psychiatric: He has a normal mood and affect. His behavior is normal.   Results for orders placed or performed in visit on 03/31/16  POCT CBC  Result Value Ref Range   WBC 6.5 4.6 - 10.2  K/uL   Lymph, poc 1.0 0.6 - 3.4   POC LYMPH PERCENT 15.5 10 - 50 %L   MID (cbc) 0.5 0 - 0.9   POC MID % 7.2 0 - 12 %M   POC Granulocyte 5.0 2 - 6.9   Granulocyte percent 77.3 37 - 80 %G   RBC 4.42 (A) 4.69 - 6.13 M/uL   Hemoglobin 13.5 (A) 14.1 - 18.1 g/dL   HCT, POC 69.6 (A) 29.5 - 53.7 %   MCV 89.4 80 - 97 fL   MCH, POC 30.6 27 - 31.2 pg   MCHC 34.2 31.8 - 35.4 g/dL   RDW, POC 28.4 %   Platelet Count, POC 287 142 - 424 K/uL   MPV 6.3 0 - 99.8 fL  POCT glucose (manual entry)  Result Value Ref Range   POC Glucose 104 (A) 70 - 99 mg/dl    Assessment and Plan: Riley Spencer is a 60 y.o. male who is here today for headache. Above test drawn. Advised to start blood pressure medication at this time, amlodipine. Advised DASH diet. Advised to record bp twice per week, and return for follow up of  Bp. Advised to return if headache does not improve.   He can use tylenol symptomatically.  Headache, unspecified headache type - Plan: POCT CBC, POCT SEDIMENTATION RATE, Basic metabolic panel, POCT glucose (manual entry), TSH  Essential hypertension - Plan: amLODipine (NORVASC) 5 MG tablet, TSH  Trena Platt, PA-C [facility] 8/10/20175:27 PM

## 2016-04-01 LAB — BASIC METABOLIC PANEL
BUN: 13 mg/dL (ref 7–25)
CALCIUM: 9 mg/dL (ref 8.6–10.3)
CO2: 26 mmol/L (ref 20–31)
CREATININE: 0.89 mg/dL (ref 0.70–1.25)
Chloride: 101 mmol/L (ref 98–110)
Glucose, Bld: 102 mg/dL — ABNORMAL HIGH (ref 65–99)
Potassium: 4.7 mmol/L (ref 3.5–5.3)
SODIUM: 138 mmol/L (ref 135–146)

## 2016-04-14 ENCOUNTER — Encounter: Payer: Self-pay | Admitting: Physician Assistant

## 2016-04-14 ENCOUNTER — Ambulatory Visit (INDEPENDENT_AMBULATORY_CARE_PROVIDER_SITE_OTHER): Payer: BC Managed Care – PPO | Admitting: Physician Assistant

## 2016-04-14 VITALS — BP 120/70 | HR 85 | Temp 98.0°F | Resp 16 | Ht 65.5 in | Wt 154.0 lb

## 2016-04-14 DIAGNOSIS — I10 Essential (primary) hypertension: Secondary | ICD-10-CM | POA: Diagnosis not present

## 2016-04-14 LAB — LIPID PANEL
Cholesterol: 173 mg/dL (ref 125–200)
HDL: 101 mg/dL (ref >=40)
LDL CALC: 56 mg/dL (ref <130)
Total CHOL/HDL Ratio: 1.7 Ratio (ref <=5.0)
Triglycerides: 80 mg/dL (ref <150)
VLDL: 16 mg/dL (ref <30)

## 2016-04-14 NOTE — Progress Notes (Signed)
Patient ID: Riley Spencer, male   DOB: [birth year], 60 y.o.   MRN: [id number] [facility] [address], [place] [address] [phone]  Date:  04/14/2016   [name]   DOB:  [birth year]   MRN:  [id number]  PCP:  Default, Provider, MD  By signing my name below, I, Charline BillsEssence Howell, attest that this documentation has been prepared under the direction and in the presence of Trena PlattStephanie English, PA-C Electronically Signed: Charline BillsEssence Howell, ED Scribe 04/14/2016 at 8:36 AM.  History of Present Illness:  Riley Spencer is a 60 y.o. male patient who [facility] follow[facility]up for regarding HTN. Pt states that he had a few HAs last week but none this week. Pt has been compliant with amlodipine daily. He reports BP readings of 180/111 on 03/31/16 at 3 PM and 134/83 on 04/09/16. Pt states that he has been avoiding pork and eating chicken, fish and fruits. He denies changes in vision, sob and chest pain.   There are no active problems to display for this patient.   No past medical history on file.  Past Surgical History:  Procedure Laterality Date   ulcers  1977-78    Social History  Substance Use Topics   Smoking status: Never Smoker   Smokeless tobacco: Not on file   Alcohol use 1.8 oz/week    3 Cans of beer per week    Family History  Problem Relation Age of Onset   Diabetes Sister     No Known Allergies  Medication list has been reviewed and updated.  Current Outpatient Prescriptions on File Prior to Visit  Medication Sig Dispense Refill   amLODipine (NORVASC) 5 MG tablet Take 1 tablet (5 mg total) by mouth daily. 30 tablet 1   ibuprofen (ADVIL,MOTRIN) 200 MG tablet Take 200 mg by mouth every 6 (six) hours as needed. Reported on 08/15/2015     No current facility-administered medications on file prior to visit.     Review of Systems  Eyes: Negative for blurred vision and double vision.  Respiratory: Negative for shortness of breath.    Cardiovascular: Negative for chest pain.  Neurological: Negative for headaches.    Physical Examination: BP 120/70    Pulse 85    Temp 98 F (36.7 C) (Oral)    Resp 16    Ht 5' 5.5" (1.664 m)    Wt 154 lb (69.9 kg)    SpO2 97%    BMI 25.24 kg/m  Ideal Body Weight: @FLOWAMB ([id number])@[phone])@  Physical Exam  Constitutional: He is oriented to person, place, and time. He appears well-developed and well-nourished. No distress.  HENT:  Head: Normocephalic and atraumatic.  Eyes: Conjunctivae and EOM are normal. Pupils are equal, round, and reactive to light.  Cardiovascular: Normal rate, regular rhythm, normal heart sounds and normal pulses.  Exam reveals no gallop and no friction rub.   No murmur heard. Pulmonary/Chest: Effort normal and breath sounds normal. No respiratory distress. He has no wheezes. He has no rhonchi. He has no rales.  Neurological: He is alert and oriented to person, place, and time.  Skin: Skin is warm and dry. He is not diaphoretic.  Psychiatric: He has a normal mood and affect. His behavior is normal.    Assessment and Plan: Riley Spencer is a 60 y.o. male who is here today for BP follow-up. Blood pressure looks awesome. Headaches have resolved. I've advised him to continue this. I've also advised that he  apply the DASH diet at this time. He will also attempt to engage in exercise 4 times a wek in 3-6 months. for 30 minutes of aerobic activity. He will follow-up.  Medication can be filled for an additional 6 months. Essential hypertension - Plan: Lipid panel  Trena PlattStephanie English, PA-C [facility] 8/26/20179:40 PM   Trena PlattStephanie English, PA-C [facility] 04/14/2016 8:28 AM

## 2016-04-14 NOTE — Patient Instructions (Addendum)
Your blood pressure is awesome.  I would like you to continue to watch your diet.  And you can also start incorporating exercise 4 times per week for 30 minutes of aerobic activity.   We will see you in 6 months.  Contact your pharmacy for medication refills.   We should also get an additional physical exam in that time.     IF you received an x-ray today, you will receive an invoice from [facility]. Please contact [facility] at [phone] with questions or concerns regarding your invoice.   IF you received labwork today, you will receive an invoice from [facility]. Please contact [facility] at [phone] with questions or concerns regarding your invoice.   Our billing staff will not be able to assist you with questions regarding bills from these companies.  You will be contacted with the lab results as soon as they are available. The fastest way to get your results is to activate your My Chart account. Instructions are located on the last page of this paperwork. If you have not heard from us regarding the results in 2 weeks, please contact this office.

## 2016-05-26 ENCOUNTER — Ambulatory Visit (INDEPENDENT_AMBULATORY_CARE_PROVIDER_SITE_OTHER): Payer: BC Managed Care – PPO | Admitting: Physician Assistant

## 2016-05-26 VITALS — BP 152/88 | HR 90 | Temp 98.0°F | Resp 16 | Ht 65.5 in | Wt 149.0 lb

## 2016-05-26 DIAGNOSIS — M791 Myalgia: Secondary | ICD-10-CM

## 2016-05-26 DIAGNOSIS — M7918 Myalgia, other site: Secondary | ICD-10-CM

## 2016-05-26 DIAGNOSIS — Z23 Encounter for immunization: Secondary | ICD-10-CM

## 2016-05-26 MED ORDER — CYCLOBENZAPRINE HCL 5 MG PO TABS: 5.0000 mg | ORAL_TABLET | Freq: Three times a day (TID) | ORAL | 1 refills | Status: AC | PRN

## 2016-05-26 MED ORDER — MELOXICAM 7.5 MG PO TABS: 7.5000 mg | ORAL_TABLET | Freq: Every day | ORAL | 0 refills | Status: AC

## 2016-05-26 NOTE — Patient Instructions (Addendum)
Please take the meloxicam.  Do not take ibuprofen or naproxen with this medication.   Please take the flexeril when not at work.  This is a muscle relaxant.  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    Piriformis Syndrome With Rehab Piriformis syndrome is a condition the affects the nervous system in the area of the hip, and is characterized by pain and possibly a loss of feeling in the backside (posterior) thigh that may extend down the entire length of the leg. The symptoms are caused by an increase in pressure on the sciatic nerve by the piriformis muscle, which is on the back of the hip and is responsible for externally rotating the hip. The sciatic nerve and its branches connect to much of the leg. Normally the sciatic nerve runs between the piriformis muscle and other muscles. However, in certain individuals the nerve runs through the muscle, which causes an increase in pressure on the nerve and results in the symptoms of piriformis syndrome. SYMPTOMS   Pain, tingling, numbness, or burning in the back of the thigh that may also extend down the entire leg.  Occasionally, tenderness in the buttock.  Loss of function of the leg.  Pain that worsens when using the piriformis muscle (running, jumping, or stairs).  Pain that increases with prolonged sitting.  Pain that is lessened by lying flat on the back. CAUSES   Piriformis syndrome is the result of an increase in pressure placed on the sciatic nerve. Oftentimes, piriformis syndrome is an overuse injury.  Stress placed on the nerve from a sudden increase in the intensity, frequency, or duration of training.  Compensation of other extremity injuries. RISK INCREASES WITH:  Sports that involve the piriformis muscle (running, walking, or jumping).  You are born with (congenital) a defect in which the sciatic nerve passes through the muscle. PREVENTION  Warm up and stretch properly before activity.  Allow for adequate  recovery between workouts.  Maintain physical fitness:  Strength, flexibility, and endurance.  Cardiovascular fitness. PROGNOSIS  If treated properly, the symptoms of piriformis syndrome usually resolve in 2 to 6 weeks. RELATED COMPLICATIONS   Persistent and possibly permanent pain and numbness in the lower extremity.  Weakness of the extremity that may progress to disability and inability to compete. TREATMENT  The most effective treatment for piriformis syndrome is rest from any activities that aggravate the symptoms. Ice and pain medication may help reduce pain and inflammation. The use of strengthening and stretching exercises may help reduce pain with activity. These exercises may be performed at home or with a therapist. A referral to a therapist may be given for further evaluation and treatment, such as ultrasound. Corticosteroid injections may be given to reduce inflammation that is causing pressure to be placed on the sciatic nerve. If nonsurgical (conservative) treatment is unsuccessful, then surgery may be recommended.  MEDICATION   If pain medication is necessary, then nonsteroidal anti-inflammatory medications, such as aspirin and ibuprofen, or other minor pain relievers, such as acetaminophen, are often recommended.  Do not take pain medication for 7 days before surgery.  Prescription pain relievers may be given if deemed necessary by your caregiver. Use only as directed and only as much as you need.  Corticosteroid injections may be given by your caregiver. These injections should be reserved for the most serious cases, because they may only be given a certain number of times. HEAT AND COLD:   Cold treatment (icing) relieves pain and reduces inflammation. Cold treatment  should be applied for 10 to 15 minutes every 2 to 3 hours for inflammation and pain and immediately after any activity that aggravates your symptoms. Use ice packs or massage the area with a piece of ice (ice  massage).  Heat treatment may be used prior to performing the stretching and strengthening activities prescribed by your caregiver, physical therapist, or athletic trainer. Use a heat pack or soak the injury in warm water. SEEK IMMEDIATE MEDICAL CARE IF:  Treatment seems to offer no benefit, or the condition worsens.  Any medications produce adverse side effects. EXERCISES RANGE OF MOTION (ROM) AND STRETCHING EXERCISES - Piriformis Syndrome These exercises may help you when beginning to rehabilitate your injury. Your symptoms may resolve with or without further involvement from your physician, physical therapist, or athletic trainer. While completing these exercises, remember:   Restoring tissue flexibility helps normal motion to return to the joints. This allows healthier, less painful movement and activity.  An effective stretch should be held for at least 30 seconds.  A stretch should never be painful. You should only feel a gentle lengthening or release in the stretched tissue. STRETCH - Hip Rotators  Lie on your back on a firm surface. Grasp your right / left knee with your right / left hand and your ankle with your opposite hand.  Keeping your hips and shoulders firmly planted, gently pull your right / left knee and rotate your lower leg toward your opposite shoulder until you feel a stretch in your buttocks.  Hold this stretch for __________ seconds. Repeat this stretch __________ times. Complete this stretch __________ times per day. STRETCH - Iliotibial Band  On the floor or bed, lie on your side so your right / left leg is on top. Bend your knee and grab your ankle.  Slowly bring your knee back so that your thigh is in line with your trunk. Keep your heel at your buttocks and gently arch your back so your head, shoulders, and hips line up.  Slowly lower your leg so that your knee approaches the floor/bed until you feel a gentle stretch on the outside of your right / left  thigh. If you do not feel a stretch and your knee will not fall farther, place the heel of your opposite foot on top of your knee and pull your thigh down farther.  Hold this stretch for __________ seconds. Repeat __________ times. Complete __________ times per day. STRENGTHENING EXERCISES - Piriformis Syndrome  These are some of the caregiver again or until your symptoms are resolved. Remember:   Strong muscles with good endurance tolerate stress better.  Do the exercises as initially prescribed by your caregiver. Progress slowly with each exercise, gradually increasing the number of repetitions and weight used under their guidance. STRENGTH - Hip Abductors, Straight Leg Raises Be aware of your form throughout the entire exercise so that you exercise the correct muscles. Sloppy form means that you are not strengthening the correct muscles.  Lie on your side so that your head, shoulders, knee, and hip line up. You may bend your lower knee to help maintain your balance. Your right / left leg should be on top.  Roll your hips slightly forward, so that your hips are stacked directly over each other and your right / left knee is facing forward.  Lift your top leg up 4-6 inches, leading with your heel. Be sure that your foot does not drift forward or that your knee does not roll toward the ceiling.  Hold this position for __________ seconds. You should feel the muscles in your outer hip lifting (you may not notice this until your leg begins to tire).  Slowly lower your leg to the starting position. Allow the muscles to fully relax before beginning the next repetition. Repeat __________ times. Complete this exercise __________ times per day.  STRENGTH - Hip Abductors, Quadruped  On a firm, lightly padded surface, position yourself on your hands and knees. Your hands should be directly below your shoulders and your knees should be directly below your hips.  Keeping your right / left knee bent,  lift your leg out to the side. Keep your legs level and in line with your shoulders.  Position yourself on your hands and knees.  Hold for __________ seconds.  Keeping your trunk steady and your hips level, slowly lower your leg to the starting position. Repeat __________ times. Complete this exercise __________ times per day.  STRENGTH - Hip Abductors, Standing  Tie one end of a rubber exercise band/tubing to a secure surface (table, pole) and tie a loop at the other end.  Place the loop around your right / left ankle. Keeping your ankle with the band directly opposite of the secured end, step away until there is tension in the tube/band.  Hold onto a chair as needed for balance.  Keeping your back upright, your shoulders over your hips, and your toes pointing forward, lift your right / left leg out to your side. Be sure to lift your leg with your hip muscles. Do not "throw" your leg or tip your body to lift your leg.  Slowly and with control, return to the starting position. Repeat exercise __________ times. Complete this exercise __________ times per day.    This information is not intended to replace advice given to you by your health care provider. Make sure you discuss any questions you have with your health care provider.   Document Released: 08/09/2005 Document Revised: 12/24/2014 Document Reviewed: 11/21/2008 Elsevier Interactive Patient Education 2016 [employer].    IF you received an x-ray today, you will receive an invoice from [facility]. Please contact [facility] at [phone] with questions or concerns regarding your invoice.   IF you received labwork today, you will receive an invoice from [employer]. Please contact [facility] at [phone] with questions or concerns regarding your invoice.   Our billing staff will not be able to assist you with questions regarding bills from these companies.  You will be contacted  with the lab results as soon as they are available. The fastest way to get your results is to activate your My Chart account. Instructions are located on the last page of this paperwork. If you have not heard from [place] regarding the results in 2 weeks, please contact this office.

## 2016-05-26 NOTE — Progress Notes (Signed)
[facility] [address], [place] [address] [phone]  By signing my name below, I, Mesha Guinyard, attest that this documentation has been prepared under the direction and in the presence of CanadaStephanie English, PA-C. Electronically Signed: Arvilla MarketMesha Guinyard, Medical Scribe. 05/26/16. 10:13 AM.  Date:  05/26/2016   [name]   DOB:  1955-11-02   MRN:  [id number]  PCP:  Default, Provider, MD   Chief Complaint  Patient presents with   Hip Pain    Right Hip.     History of Present Illness:  Riley Spencer is a 60 y.o. male patient who [facility] complaining of right hip pain that radiates mid thigh to upper butock onset a week. Pt reports sleep disturbnce last night due to pain and states one weekend he couldn't even sit down due to his leg pain. Pt's leg occasionally gives away and the pain is crippling at times. Pt has to walk a lot and often lifts heavy things, weighing 75-80lbs, for 4 hours shift at work. Pt would like to get into another department. Pt had hip pain last Dec. Pt has rubbed his hip with icy-hot ointment without relief to his symptoms. Pt was told one of his legs was shorter than the other and states he isn't bow legged. Pt states he exercises. Pt denies falling when his legs give out, and bowel or urinary incontinence.  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   HTN: Pt is compliant with his bp medication.  Immunizations: Pt would like to get his flu shot.  There are no active problems to display for this patient.   No past medical history on file.  Past Surgical History:  Procedure Laterality Date   ulcers  1977-78    Social History  Substance Use Topics   Smoking status: Never Smoker   Smokeless tobacco: Not on file   Alcohol use 1.8 oz/week    3 Cans of beer per week    Family History  Problem Relation Age of Onset   Diabetes Sister     No Known Allergies  Medication list  has been reviewed and updated.  Current Outpatient Prescriptions on File Prior to Visit  Medication Sig Dispense Refill   amLODipine (NORVASC) 5 MG tablet Take 1 tablet (5 mg total) by mouth daily. 30 tablet 1   ibuprofen (ADVIL,MOTRIN) 200 MG tablet Take 200 mg by mouth every 6 (six) hours as needed. Reported on 08/15/2015     No current facility-administered medications on file prior to visit.     Review of Systems  Genitourinary: Negative for urgency.  Musculoskeletal: Positive for joint pain (hip) and myalgias (thigh).    Physical Examination: BP (!) 152/88    Pulse 90    Temp 98 F (36.7 C) (Oral)    Resp 16    Ht 5' 5.5" (1.664 m)    Wt 149 lb (67.6 kg)    SpO2 99%    BMI 24.42 kg/m  Ideal Body Weight: @FLOWAMB ([id number])@([phone])@  Physical Exam  Constitutional: He is oriented to person, place, and time. He appears well-developed and well-nourished. No distress.  HENT:  Head: Normocephalic and atraumatic.  Eyes: Conjunctivae and EOM are normal. Pupils are equal, round, and reactive to light.  Cardiovascular: Normal rate.   Pulmonary/Chest: Effort normal. No respiratory distress.  Musculoskeletal:  tenderness along the right piriformis muscle  Neurological: He is alert and oriented to person, place, and  time.  Skin: Skin is warm and dry. He is not diaphoretic.  Psychiatric: He has a normal mood and affect. His behavior is normal.   Assessment and Plan: Riley Spencer is a 60 y.o. male who is here today for cc of pain in right hip. This appears to be possible piriformis etiology. Have advised icing it 3 times a day for 15 minutes. I'm also giving him an anti-inflammatory and muscle relaxant. He was advised of precautions. He will return in 1 week for follow-up. At this time he does need some work restrictions such as carrying or lifting heavy objects. Please see letter. He requests to be put in a less strenuous division of his job. I've advised him that I cannot control that at this  time. We will have to follow-up and see how he is doing after 1 week of treating his symptoms. Piriformis muscle pain - Plan: meloxicam (MOBIC) 7.5 MG tablet, cyclobenzaprine (FLEXERIL) 5 MG tablet  Need for prophylactic vaccination and inoculation against influenza - Plan: Flu Vaccine QUAD 36+ mos IM  Trena Platt, PA-C [facility] 10/4/20174:22 PM I personally performed the services described in this documentation, which was scribed in my presence. The recorded information has been reviewed and is accurate.

## 2016-06-01 ENCOUNTER — Encounter: Payer: Self-pay | Admitting: Physician Assistant

## 2016-06-01 ENCOUNTER — Ambulatory Visit (INDEPENDENT_AMBULATORY_CARE_PROVIDER_SITE_OTHER): Payer: BC Managed Care – PPO | Admitting: Physician Assistant

## 2016-06-01 VITALS — BP 136/84 | HR 111 | Temp 98.4°F | Resp 17 | Ht 65.25 in | Wt 151.0 lb

## 2016-06-01 DIAGNOSIS — G5701 Lesion of sciatic nerve, right lower limb: Secondary | ICD-10-CM | POA: Diagnosis not present

## 2016-06-01 DIAGNOSIS — I10 Essential (primary) hypertension: Secondary | ICD-10-CM | POA: Diagnosis not present

## 2016-06-01 LAB — BASIC METABOLIC PANEL WITH GFR
BUN: 13 mg/dL (ref 7–25)
CALCIUM: 8.8 mg/dL (ref 8.6–10.3)
CO2: 22 mmol/L (ref 20–31)
CREATININE: 1 mg/dL (ref 0.70–1.25)
Chloride: 105 mmol/L (ref 98–110)
GFR, Est Non African American: 80 mL/min (ref >=60)
GLUCOSE: 117 mg/dL — AB (ref 65–99)
Potassium: 4.3 mmol/L (ref 3.5–5.3)
Sodium: 139 mmol/L (ref 135–146)

## 2016-06-01 LAB — POCT CBC
GRANULOCYTE PERCENT: 80.6 % — AB (ref 37–80)
HEMATOCRIT: 36.4 % — AB (ref 43.5–53.7)
Hemoglobin: 12.8 g/dL — AB (ref 14.1–18.1)
Lymph, poc: 1 (ref 0.6–3.4)
MCH: 31.7 pg — AB (ref 27–31.2)
MCHC: 35.1 g/dL (ref 31.8–35.4)
MCV: 90.3 fL (ref 80–97)
MID (CBC): 0.4 (ref 0–0.9)
MPV: 7.1 fL (ref 0–99.8)
POC GRANULOCYTE: 5.7 (ref 2–6.9)
POC LYMPH %: 14.3 % (ref 10–50)
POC MID %: 5.1 %M (ref 0–12)
Platelet Count, POC: 223 10*3/uL (ref 142–424)
RBC: 4.03 M/uL — AB (ref 4.69–6.13)
RDW, POC: 16 %
WBC: 7.1 10*3/uL (ref 4.6–10.2)

## 2016-06-01 LAB — TSH: TSH: 2.95 mIU/L (ref 0.40–4.50)

## 2016-06-01 MED ORDER — PREDNISONE 20 MG PO TABS: ORAL_TABLET | ORAL | 0 refills | Status: AC

## 2016-06-01 MED ORDER — AMLODIPINE BESYLATE 5 MG PO TABS: 5.0000 mg | ORAL_TABLET | Freq: Every day | ORAL | 1 refills | Status: DC

## 2016-06-01 MED ORDER — AMLODIPINE BESYLATE 5 MG PO TABS: 5.0000 mg | ORAL_TABLET | Freq: Every day | ORAL | 2 refills | Status: DC

## 2016-06-01 NOTE — Patient Instructions (Addendum)
You can continue to ice the area three times per day for 15 minutes.   Continue the stretches.  I am placing a referral to orthopedics. Please await contact.  I am refilling your blood pressure medication for 3 months I have re-written work restrictions.  Please submit to your employer and they will decide how to utilize you.    IF you received an x-ray today, you will receive an invoice from [facility]. Please contact [facility] at [phone] with questions or concerns regarding your invoice.   IF you received labwork today, you will receive an invoice from [facility]. Please contact [facility] at [phone] with questions or concerns regarding your invoice.   Our billing staff will not be able to assist you with questions regarding bills from these companies.  You will be contacted with the lab results as soon as they are available. The fastest way to get your results is to activate your My Chart account. Instructions are located on the last page of this paperwork. If you have not heard from us regarding the results in 2 weeks, please contact this office.

## 2016-06-01 NOTE — Progress Notes (Addendum)
Patient ID: Riley Spencer, male   DOB: Sep 22, 1955, 60 y.o.   MRN: [id number] [facility] [address] [place] [address] [phone]  By signing my name below, I, Essence Howell, attest that this documentation has been prepared under the direction and in the presence of Trena Platt, PA-C Electronically Signed: Charline Bills, ED Scribe 06/01/2016 at 8:18 AM.  Date:  06/01/2016   [name]   DOB:  [birth year]   MRN:  [id number]  PCP:  Default, Provider, MD   History of Present Illness:  Riley Spencer is a 60 y.o. male patient who presents to [facility] for a follow-up of right hip pain. Pt was seen in the office on 10/4 for 1 week h/o right hip pain. He states that he has not returned to either job due to aching hip pain that intermittently radiates down into his right leg. Pt states that he has been compliant with Flexeril, Mobic and stretches without relief of symptoms. He denies bladder/bowel incontinence.  Pt also states that he has been compliant with Norvasc but is almost out. He requests a refill at this visit. Pt reports some diaphoresis yesterday and today but denies chest pain, palpitations and sob.   Chief Complaint  Patient presents with   Follow-up    Hip pain, states medication "not working"    There are no active problems to display for this patient.   No past medical history on file.  Past Surgical History:  Procedure Laterality Date   ulcers  1977-78    Social History  Substance Use Topics   Smoking status: Never Smoker   Smokeless tobacco: Not on file   Alcohol use 1.8 oz/week    3 Cans of beer per week    Family History  Problem Relation Age of Onset   Diabetes Sister     No Known Allergies  Medication list has been reviewed and updated.  Current Outpatient Prescriptions on File Prior to Visit  Medication Sig Dispense Refill   amLODipine (NORVASC) 5 MG tablet Take 1 tablet (5 mg total) by mouth daily. 30  tablet 1   cyclobenzaprine (FLEXERIL) 5 MG tablet Take 1 tablet (5 mg total) by mouth 3 (three) times daily as needed for muscle spasms. 30 tablet 1   ibuprofen (ADVIL,MOTRIN) 200 MG tablet Take 200 mg by mouth every 6 (six) hours as needed. Reported on 08/15/2015     meloxicam (MOBIC) 7.5 MG tablet Take 1 tablet (7.5 mg total) by mouth daily. 30 tablet 0   No current facility-administered medications on file prior to visit.     Review of Systems  Constitutional: Positive for diaphoresis.  Respiratory: Negative for shortness of breath.   Cardiovascular: Negative for chest pain and palpitations.  Musculoskeletal: Positive for joint pain and myalgias.    Physical Examination: BP 136/84 (BP Location: Left Arm, Patient Position: Sitting, Cuff Size: Normal)    Pulse (!) 111    Temp 98.4 F (36.9 C) (Oral)    Resp 17    Ht 5' 5.25" (1.657 m)    Wt 151 lb (68.5 kg)    SpO2 98%    BMI 24.94 kg/m  Ideal Body Weight: @FLOWAMB ([id number])@  Physical Exam  Constitutional: He is oriented to person, place, and time. He appears well-developed and well-nourished. No distress.  HENT:  Head: Normocephalic and atraumatic.  Eyes: Conjunctivae and EOM are normal. Pupils are equal, round, and reactive to light.  Cardiovascular: Regular rhythm.  Tachycardia  present.   Mildly tachycardic  Pulmonary/Chest: Effort normal. No respiratory distress.  Musculoskeletal: Normal range of motion.  R hip: Full ROM. No pain incited with lateral deviation or horizontal rotation    Neurological: He is alert and oriented to person, place, and time.  Skin: Skin is warm and dry. He is not diaphoretic.  Psychiatric: He has a normal mood and affect. His behavior is normal.    Assessment and Plan: Riley Spencer is a 60 y.o. male who is here today for follow up of back pain and blood pressure recheck. At this time,  I can not see where his pain is incited.  He has asked that he be removed from his work activity despite a  very normal bodied exam.  At this time orthopedic consult is appreciated.   We will do the prednisone orally for low 6 day taper.   bp medicine given for 3 months. Piriformis syndrome, right - Plan: AMB referral to orthopedics, POCT CBC  Essential hypertension - Plan: POCT CBC, BASIC METABOLIC PANEL WITH GFR, amLODipine (NORVASC) 5 MG tablet, TSH, DISCONTINUED: amLODipine (NORVASC) 5 MG tablet  Trena PlattStephanie English, PA-C [facility] 10/10/20178:50 AM  I personally performed the services described in this documentation, which was scribed in my presence. The recorded information has been reviewed and is accurate.

## 2016-06-02 ENCOUNTER — Telehealth: Payer: Self-pay | Admitting: *Deleted

## 2016-06-02 ENCOUNTER — Encounter: Payer: Self-pay | Admitting: *Deleted

## 2016-06-02 NOTE — Telephone Encounter (Signed)
Patient wanted to know when he should come back to recheck.  He states that he is feeling better with the new medication.

## 2016-06-03 NOTE — Telephone Encounter (Signed)
If he feels like he is 100% better, than we can write a letter for him without an office visit. Otherwise he can return asap for recheck

## 2016-06-07 NOTE — Telephone Encounter (Signed)
Per patient is feeling 100% better and he does not need a letter at this time. Will call back if he does

## 2016-08-10 ENCOUNTER — Other Ambulatory Visit: Payer: Self-pay | Admitting: Physician Assistant

## 2016-08-10 DIAGNOSIS — I10 Essential (primary) hypertension: Secondary | ICD-10-CM

## 2018-04-23 DEATH — deceased
